# Patient Record
Sex: Male | Born: 1990 | Race: Black or African American | Hispanic: No | Marital: Single | Smoking: Former smoker
Health system: Southern US, Community
[De-identification: ages and names within clinical notes are randomized; demographics above are authoritative.]

---
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
## 2015-09-29 ENCOUNTER — Emergency Department (HOSPITAL_COMMUNITY)
Admission: EM | Admit: 2015-09-29 | Discharge: 2015-09-29 | Disposition: A | Discharge status: Home / Self Care | Payer: Medicaid Other | Attending: Emergency Medicine | Admitting: Emergency Medicine

## 2015-09-29 ENCOUNTER — Encounter (HOSPITAL_COMMUNITY): Payer: Self-pay | Admitting: Emergency Medicine

## 2015-09-29 DIAGNOSIS — L723 Sebaceous cyst: Secondary | ICD-10-CM | POA: Insufficient documentation

## 2015-09-29 DIAGNOSIS — L089 Local infection of the skin and subcutaneous tissue, unspecified: Secondary | ICD-10-CM

## 2015-09-29 DIAGNOSIS — Z720 Tobacco use: Secondary | ICD-10-CM | POA: Insufficient documentation

## 2015-09-29 MED ORDER — LIDOCAINE HCL (PF) 1 % IJ SOLN
2.0000 mL | Freq: Once | INTRAMUSCULAR | Status: AC
  Administered 2015-09-29: 2 mL
  Filled 2015-09-29: qty 5

## 2015-09-29 NOTE — ED Provider Notes (Signed)
CSN: [id number]     Arrival date & time 09/29/15  2104 History  By signing my name below, I, Phillis Haggis, attest that this documentation has been prepared under the direction and in the presence of Earley Favor, NP-C. Electronically Signed: Phillis Haggis, ED Scribe. 09/29/2015. 10:15 PM.  Chief Complaint  Patient presents with  . Skin Problem   The history is provided by the patient. No language interpreter was used.  HPI Comments: Randy Arias is a 24 y.o. male who presents to the Emergency Department complaining of a gradually resolving bump to the right side of his mouth onset 3 weeks ago. Pt states he is unable to "make the bump go down." He states that it first appeared as a white head, which he popped, but states that it came back. He denies pain to the area.   History reviewed. No pertinent past medical history. History reviewed. No pertinent past surgical history. No family history on file. Social History  Substance Use Topics  . Smoking status: Current Every Day Smoker  . Smokeless tobacco: None  . Alcohol Use: Yes    Review of Systems  Skin: Positive for wound.  All other systems reviewed and are negative.     Allergies  Review of patient's allergies indicates no known allergies.  Home Medications   Prior to Admission medications   Medication Sig Start Date End Date Taking? Authorizing Provider  Phenyleph-Doxylamine-DM-APAP (NYQUIL SEVERE COLD/FLU) 5-6.25-10-325 MG/15ML LIQD Take 30 mLs by mouth every 6 (six) hours as needed (cold).   Yes Historical Provider, MD   BP 145/86 mmHg  Pulse 92  Temp(Src) 98 F (36.7 C) (Oral)  Resp 16  SpO2 100% Physical Exam  Constitutional: He appears well-developed and well-nourished.  HENT:  Head: Normocephalic.    Eyes: Pupils are equal, round, and reactive to light.  Neck: Normal range of motion.  Cardiovascular: Normal rate and regular rhythm.   Pulmonary/Chest: Effort normal and breath sounds normal.   Musculoskeletal: Normal range of motion.  Neurological: He is alert.  Skin: No erythema.  Nursing note and vitals reviewed.   ED Course  Procedures (including critical care time) DIAGNOSTIC STUDIES: Oxygen Saturation is 100% on RA, normal by my interpretation.    COORDINATION OF CARE: 9:41 PM-Discussed treatment plan which includes I&D with pt at bedside and pt agreed to plan.   INCISION AND DRAINAGE PROCEDURE NOTE: Patient identification was confirmed and verbal consent was obtained. This procedure was performed by Earley Favor, NP-C at 10:14 PM. Site: right side of mouth Sterile procedures observed Needle size: 25 Anesthetic used (type and amt): 1% lidocaine without epi Blade size: 11 Drainage: thick purulent  Complexity: Complex Packing usednone Site anesthetized, incision made over site, wound drained and explored loculations, rinsed with copious amounts of normal saline, wound packed with sterile gauze, covered with dry, sterile dressing.  Pt tolerated procedure well without complications.  Instructions for care discussed verbally and pt provided with additional written instructions for homecare and f/u.   Labs Review Labs Reviewed - No data to display  Imaging Review No results found. I have personally reviewed and evaluated these images and lab results as part of my medical decision-making.   EKG Interpretation None     Patient to apply warm compresses tonight  MDM   Final diagnoses:  Infected sebaceous cyst of skin   I personally performed the services described in this documentation, which was scribed in my presence. The recorded information has been reviewed and is  accurate.   Earley FavorGail Dalbert Stillings, NP 09/29/15 [id number]  Bethann BerkshireJoseph Zammit, MD 10/03/15 [phone]

## 2015-09-29 NOTE — ED Notes (Signed)
Pt states he has had a bump on his face x 3 weeks and can't get it to go down. Area is to the R of his mouth. Alert and oriented.

## 2015-09-29 NOTE — Discharge Instructions (Signed)
Sebaceous Cyst Removal, Care After Refer to this sheet in the next few weeks. These instructions provide you with information about caring for yourself after your procedure. Your health care provider may also give you more specific instructions. Your treatment has been planned according to current medical practices, but problems sometimes occur. Call your health care provider if you have any problems or questions after your procedure. WHAT TO EXPECT AFTER THE PROCEDURE After your procedure, it is common to have:  Soreness in the area where your cyst was removed.  Tightness or itching from your skin sutures. HOME CARE INSTRUCTIONS  Take medicines only as directed by your health care provider.  If you were prescribed an antibiotic medicine, finish all of it even if you start to feel better.  Use antibiotic ointment as directed by your health care provider. Follow the instructions carefully.  There are many different ways to close and cover an incision, including stitches (sutures), skin glue, and adhesive strips. Follow your health care provider's instructions about:  Incision care.  Bandage (dressing) changes and removal.  Incision closure removal.  Keep the bandage (dressing) dry until your health care provider says that it can be removed. Take sponge baths only. Ask your health care provider when you can start showering or taking a bath.  After your dressing is off, check your incision every day for signs of infection. Watch for:  Redness, swelling, or pain.  Fluid, blood, or pus.  You can return to your normal activities. Do not do anything that stretches or puts pressure on your incision.  You can return to your normal diet.  Keep all follow-up visits as directed by your health care provider. This is important. SEEK MEDICAL CARE IF:  You have a fever.  Your incision bleeds.  You have redness, swelling, or pain in the incision area.  You have fluid, blood, or pus coming  from your incision.  Your cyst comes back after surgery.   This information is not intended to replace advice given to you by your health care provider. Make sure you discuss any questions you have with your health care provider.   Document Released: 12/10/2014 Document Reviewed: 12/10/2014 Elsevier Interactive Patient Education [employer]. You had a small infected cyst that was open and drained tonight please use a warm compress to the area

## 2015-11-05 ENCOUNTER — Emergency Department (HOSPITAL_COMMUNITY): Payer: Medicaid Other

## 2015-11-05 ENCOUNTER — Encounter (HOSPITAL_COMMUNITY): Payer: Self-pay | Admitting: Emergency Medicine

## 2015-11-05 ENCOUNTER — Emergency Department (HOSPITAL_COMMUNITY)
Admission: EM | Admit: 2015-11-05 | Discharge: 2015-11-06 | Disposition: A | Discharge status: Home / Self Care | Payer: Medicaid Other | Attending: Emergency Medicine | Admitting: Emergency Medicine

## 2015-11-05 DIAGNOSIS — R21 Rash and other nonspecific skin eruption: Secondary | ICD-10-CM

## 2015-11-05 DIAGNOSIS — Y9389 Activity, other specified: Secondary | ICD-10-CM | POA: Insufficient documentation

## 2015-11-05 DIAGNOSIS — W231XXA Caught, crushed, jammed, or pinched between stationary objects, initial encounter: Secondary | ICD-10-CM | POA: Insufficient documentation

## 2015-11-05 DIAGNOSIS — N4889 Other specified disorders of penis: Secondary | ICD-10-CM | POA: Insufficient documentation

## 2015-11-05 DIAGNOSIS — S5001XA Contusion of right elbow, initial encounter: Secondary | ICD-10-CM | POA: Insufficient documentation

## 2015-11-05 DIAGNOSIS — Y9289 Other specified places as the place of occurrence of the external cause: Secondary | ICD-10-CM | POA: Insufficient documentation

## 2015-11-05 DIAGNOSIS — Y990 Civilian activity done for income or pay: Secondary | ICD-10-CM | POA: Insufficient documentation

## 2015-11-05 DIAGNOSIS — F17200 Nicotine dependence, unspecified, uncomplicated: Secondary | ICD-10-CM | POA: Insufficient documentation

## 2015-11-05 NOTE — Discharge Instructions (Signed)
Contusion A contusion is a deep bruise. Contusions are the result of a blunt injury to tissues and muscle fibers under the skin. The injury causes bleeding under the skin. The skin overlying the contusion may turn blue, purple, or yellow. Minor injuries will give you a painless contusion, but more severe contusions may stay painful and swollen for a few weeks.  CAUSES  This condition is usually caused by a blow, trauma, or direct force to an area of the body. SYMPTOMS  Symptoms of this condition include:  Swelling of the injured area.  Pain and tenderness in the injured area.  Discoloration. The area may have redness and then turn blue, purple, or yellow. DIAGNOSIS  This condition is diagnosed based on a physical exam and medical history. An X-ray, CT scan, or MRI may be needed to determine if there are any associated injuries, such as broken bones (fractures). TREATMENT  Specific treatment for this condition depends on what area of the body was injured. In general, the best treatment for a contusion is resting, icing, applying pressure to (compression), and elevating the injured area. This is often called the RICE strategy. Over-the-counter anti-inflammatory medicines may also be recommended for pain control.  HOME CARE INSTRUCTIONS   Rest the injured area.  If directed, apply ice to the injured area:  Put ice in a plastic bag.  Place a towel between your skin and the bag.  Leave the ice on for 20 minutes, 2-3 times per day.  If directed, apply light compression to the injured area using an elastic bandage. Make sure the bandage is not wrapped too tightly. Remove and reapply the bandage as directed by your health care provider.  If possible, raise (elevate) the injured area above the level of your heart while you are sitting or lying down.  Take over-the-counter and prescription medicines only as told by your health care provider. SEEK MEDICAL CARE IF:  Your symptoms do not  improve after several days of treatment.  Your symptoms get worse.  You have difficulty moving the injured area. SEEK IMMEDIATE MEDICAL CARE IF:   You have severe pain.  You have numbness in a hand or foot.  Your hand or foot turns pale or cold.   This information is not intended to replace advice given to you by your health care provider. Make sure you discuss any questions you have with your health care provider.   Document Released: 08/29/2005 Document Revised: 08/10/2015 Document Reviewed: 04/06/2015 Elsevier Interactive Patient Education 2016 Elsevier Inc. Rash A rash is a change in the color or texture of the skin. There are many different types of rashes. You may have other problems that accompany your rash. CAUSES   Infections.  Allergic reactions. This can include allergies to pets or foods.  Certain medicines.  Exposure to certain chemicals, soaps, or cosmetics.  Heat.  Exposure to poisonous plants.  Tumors, both cancerous and noncancerous. SYMPTOMS   Redness.  Scaly skin.  Itchy skin.  Dry or cracked skin.  Bumps.  Blisters.  Pain. DIAGNOSIS  Your caregiver may do a physical exam to determine what type of rash you have. A skin sample (biopsy) may be taken and examined under a microscope. TREATMENT  Treatment depends on the type of rash you have. Your caregiver may prescribe certain medicines. For serious conditions, you may need to see a skin doctor (dermatologist). HOME CARE INSTRUCTIONS   Avoid the substance that caused your rash.  Do not scratch your rash. This can  cause infection.  You may take cool baths to help stop itching.  Only take over-the-counter or prescription medicines as directed by your caregiver.  Keep all follow-up appointments as directed by your caregiver. SEEK IMMEDIATE MEDICAL CARE IF:  You have increasing pain, swelling, or redness.  You have a fever.  You have new or severe symptoms.  You have body aches,  diarrhea, or vomiting.  Your rash is not better after 3 days. MAKE SURE YOU:  Understand these instructions.  Will watch your condition.  Will get help right away if you are not doing well or get worse.   This information is not intended to replace advice given to you by your health care provider. Make sure you discuss any questions you have with your health care provider.   Document Released: 11/09/2002 Document Revised: 12/10/2014 Document Reviewed: 04/06/2015 Elsevier Interactive Patient Education [employer]

## 2015-11-05 NOTE — ED Provider Notes (Signed)
CSN: [id number]     Arrival date & time 11/05/15  2118 History  By signing my name below, I, Elon Spanner, attest that this documentation has been prepared under the direction and in the presence of Trisha Mangle, PA-C. Electronically Signed: Elon Spanner ED Scribe. 11/05/2015. 10:14 PM.    Chief Complaint  Patient presents with  . Rash  . Arm Pain   The history is provided by the patient. No language interpreter was used.  HPI Comments: Randy Arias is a 24 y.o. male who presents to the Emergency Department complaining of an improving rash on the tip of the penis onset 1.5 weeks ago.  He notes unprotected sex with anew partner.  He denies new dermatologic exposures.  He denies fever, chills, penile discharge.  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  He reports his hand was numb briefly during the incident, however, this has resolved and he notes persistent, moderate pain in the area.   History reviewed. No pertinent past medical history. History reviewed. No pertinent past surgical history. No family history on file. Social History  Substance Use Topics  . Smoking status: Current Every Day Smoker  . Smokeless tobacco: None  . Alcohol Use: Yes    Review of Systems A complete 10 system review of systems was obtained and all systems are negative except as noted in the HPI and PMH.   Allergies  Review of patient's allergies indicates no known allergies.  Home Medications   Prior to Admission medications   Not on File   BP 165/87 mmHg  Pulse 97  Temp(Src) 97.8 F (36.6 C) (Oral)  Resp 16  SpO2 99% Physical Exam  Constitutional: He is oriented to person, place, and time. He appears well-developed and well-nourished. No distress.  HENT:  Head: Normocephalic and atraumatic.  Eyes: Conjunctivae and EOM are normal.  Neck: Neck supple. No tracheal deviation present.  Cardiovascular:  Normal rate.   Pulmonary/Chest: Effort normal. No respiratory distress.  Genitourinary:  Distal penis is circumcised and there is a fine, pin-point, raised rash to the proximal glans.  Musculoskeletal: Normal range of motion.  Erythema and slight swelling to right mid-forearm.  Neurological: He is alert and oriented to person, place, and time.  Skin: Skin is warm and dry.  Psychiatric: He has a normal mood and affect. His behavior is normal.  Nursing note and vitals reviewed.   ED Course  Procedures (including critical care time)  DIAGNOSTIC STUDIES: Oxygen Saturation is 99% on RA, normal by my interpretation.    COORDINATION OF CARE:  10:14 PM Discussed suspicion of dermatitis.  Will order labs to r/o STD.  Will order imaging of left forearm.  Patient acknowledges and agrees with plan.    Labs Review Labs Reviewed - No data to display  Imaging Review No results found. I have personally reviewed and evaluated these images and lab results as part of my medical decision-making.   EKG Interpretation None      MDM gc and ct pending.  Pt advised to use mild soap.  Ace wrap to right arm.     Final diagnoses:  Contusion, elbow, right, initial encounter  Rash of penis      I personally performed the services in this documentation, which was scribed in my presence.  The recorded information has been reviewed and considered.   Barnet Pall.  Elson Areas, PA-C 11/06/15 [phone]  Lorre NickAnthony Allen, MD 11/12/15 [phone]

## 2015-11-05 NOTE — ED Notes (Signed)
Pt from home c/o rash to penis. He reports unprotected sex with a new partner. Denies Dysuria. He is also reporting left arm pain from getting his arm caught in between a for lift today. Good ROM present.

## 2015-11-07 LAB — GC/CHLAMYDIA PROBE AMP (~~LOC~~) NOT AT ARMC
CHLAMYDIA, DNA PROBE: NEGATIVE
NEISSERIA GONORRHEA: NEGATIVE

## 2016-11-15 ENCOUNTER — Emergency Department (HOSPITAL_COMMUNITY)
Admission: EM | Admit: 2016-11-15 | Discharge: 2016-11-15 | Disposition: A | Discharge status: Home / Self Care | Payer: Medicaid Other | Attending: Physician Assistant | Admitting: Physician Assistant

## 2016-11-15 ENCOUNTER — Encounter (HOSPITAL_COMMUNITY): Payer: Self-pay | Admitting: *Deleted

## 2016-11-15 DIAGNOSIS — Z87891 Personal history of nicotine dependence: Secondary | ICD-10-CM | POA: Insufficient documentation

## 2016-11-15 DIAGNOSIS — R21 Rash and other nonspecific skin eruption: Secondary | ICD-10-CM | POA: Insufficient documentation

## 2016-11-15 LAB — URINALYSIS, ROUTINE W REFLEX MICROSCOPIC
Bacteria, UA: NONE SEEN
Bilirubin Urine: NEGATIVE
GLUCOSE, UA: NEGATIVE mg/dL
HGB URINE DIPSTICK: NEGATIVE
Ketones, ur: 5 mg/dL — AB
Leukocytes, UA: NEGATIVE
NITRITE: NEGATIVE
PH: 5 (ref 5.0–8.0)
PROTEIN: NEGATIVE mg/dL
SPECIFIC GRAVITY, URINE: 1.028 (ref 1.005–1.030)

## 2016-11-15 LAB — GC/CHLAMYDIA PROBE AMP (~~LOC~~) NOT AT ARMC
Chlamydia: NEGATIVE
NEISSERIA GONORRHEA: NEGATIVE

## 2016-11-15 MED ORDER — CLOTRIMAZOLE 1 % EX CREA: TOPICAL_CREAM | CUTANEOUS | 0 refills | Status: AC

## 2016-11-15 NOTE — Discharge Instructions (Signed)
We think that your genital exam today is normal. I do not see any problems with yeast, or STDs. However we sent yourr urine 2 check for STDs. If you have development of that white rash again, you may use this cream to help with yeast.

## 2016-11-15 NOTE — ED Provider Notes (Signed)
[facility]-EMERGENCY DEPT Provider Note   CSN: [id number] Arrival date & time: 11/15/16  [id number]     History   Chief Complaint Chief Complaint  Patient presents with  . Rash    HPI Randy Arias is a 25 y.o. male.  HPI   Is very pleasant 25 year old male presenting with rash on his penis. Patient reports that a week ago he had some white rash to the tip of his penis. He used some Dove soap on it because it looked like a dry skin that was also present on other parts of his body. It got much better. However he was coming here just to make sure everything was okay. Patient's had no recent unprotected sex. No discharge. No burning with urination. No fevers. No other symptoms.  Has no pain during sex, urination, or palpation.  History reviewed. No pertinent past medical history.  There are no active problems to display for this patient.   History reviewed. No pertinent surgical history.     Home Medications    Prior to Admission medications   Not on File    Family History History reviewed. No pertinent family history.  Social History Social History  Substance Use Topics  . Smoking status: Former Games developermoker  . Smokeless tobacco: Never Used  . Alcohol use Yes     Allergies   Patient has no known allergies.   Review of Systems Review of Systems  Constitutional: Negative for fatigue.  Genitourinary: Negative for difficulty urinating, discharge, dysuria, enuresis, flank pain, frequency, genital sores, penile pain, penile swelling and testicular pain.  All other systems reviewed and are negative.    Physical Exam Updated Vital Signs BP (!) 163/110 (BP Location: Left Arm)   Pulse 108   Temp 98.4 F (36.9 C) (Oral)   Resp 18   Ht 6\' 3"  (1.905 m)   Wt 165 lb (74.8 kg)   SpO2 99%   BMI 20.62 kg/m   Physical Exam  Constitutional: He is oriented to person, place, and time. He appears well-nourished.  HENT:  Head: Normocephalic.  Eyes: Conjunctivae are normal.    Cardiovascular: Normal rate.   Pulmonary/Chest: Effort normal.  Genitourinary:  Genitourinary Comments: Normal-appearing genitalia. No rashes. No lesions. No discharge.  Neurological: He is oriented to person, place, and time.  Skin: Skin is warm and dry. He is not diaphoretic.  Psychiatric: He has a normal mood and affect. His behavior is normal.     ED Treatments / Results  Labs (all labs ordered are listed, but only abnormal results are displayed) Labs Reviewed  URINALYSIS, ROUTINE W REFLEX MICROSCOPIC - Abnormal; Notable for the following:       Result Value   Color, Urine AMBER (*)    APPearance TURBID (*)    Ketones, ur 5 (*)    Squamous Epithelial / LPF 0-5 (*)    All other components within normal limits  GC[facility]AMYDIA [facility] AMP [facility]    EKG  EKG Interpretation None       Radiology No results found.  Procedures Procedures (including critical care time)  Medications Ordered in ED Medications - No data to display   Initial Impression / Assessment and Plan / ED Course  I have reviewed the triage vital signs and the nursing notes.  Pertinent labs & imaging results that were available during my care of the patient were reviewed by me and considered in my medical decision making (see chart for details).  Clinical Course  Patient is a pleasant 25 year old male presenting with white bumps on his penis. He reports the use of Dove soap and he got better. I see no evidence of any kind of abnormal rash at this point. He does have tiny flesh-colored colored papules that appear to be withinnormal skin findings on the genitalia exam. There is no pain. No vesicles. No erythema.  We'll give him a cream for yeast encase what he had previously was a yeast  infection. We'll have him follow-up as needed. GC chlamydia sent.  Final Clinical Impressions(s) / ED Diagnoses   Final diagnoses:  None    New Prescriptions New Prescriptions   No medications  on file     Katlen Seyer Randall AnLyn Brant Peets, MD 11/15/16 [phone]

## 2016-11-15 NOTE — ED Notes (Signed)
Bed: [facility] Expected date:  Expected time:  Means of arrival:  Comments: 

## 2016-11-15 NOTE — ED Triage Notes (Signed)
Patient is alert and oriented x4.  He is being seen for a rash that started 2 weeks ago around the head of his penis.  Patient states that he looked it up on the Internet and that it could cause kidney problems and thinks he might need a CT.  Currently he rates his pain as pressure.

## 2016-11-15 NOTE — ED Notes (Signed)
Bed: WA17 Expected date:  Expected time:  Means of arrival:  Comments: [facility] D  

## 2020-09-11 ENCOUNTER — Emergency Department (HOSPITAL_COMMUNITY)
Admission: EM | Admit: 2020-09-11 | Discharge: 2020-09-11 | Disposition: A | Discharge status: Home / Self Care | Payer: No Typology Code available for payment source | Attending: Emergency Medicine | Admitting: Emergency Medicine

## 2020-09-11 ENCOUNTER — Emergency Department (HOSPITAL_COMMUNITY): Payer: No Typology Code available for payment source

## 2020-09-11 ENCOUNTER — Encounter (HOSPITAL_COMMUNITY): Payer: Self-pay | Admitting: Radiology

## 2020-09-11 DIAGNOSIS — S50311A Abrasion of right elbow, initial encounter: Secondary | ICD-10-CM | POA: Insufficient documentation

## 2020-09-11 DIAGNOSIS — Y92410 Unspecified street and highway as the place of occurrence of the external cause: Secondary | ICD-10-CM | POA: Insufficient documentation

## 2020-09-11 DIAGNOSIS — Z20822 Contact with and (suspected) exposure to covid-19: Secondary | ICD-10-CM | POA: Diagnosis not present

## 2020-09-11 DIAGNOSIS — T1490XA Injury, unspecified, initial encounter: Secondary | ICD-10-CM

## 2020-09-11 DIAGNOSIS — S80212A Abrasion, left knee, initial encounter: Secondary | ICD-10-CM | POA: Diagnosis not present

## 2020-09-11 DIAGNOSIS — S80912A Unspecified superficial injury of left knee, initial encounter: Secondary | ICD-10-CM | POA: Diagnosis present

## 2020-09-11 LAB — COMPREHENSIVE METABOLIC PANEL
ALT: 17 U/L (ref 0–44)
AST: 27 U/L (ref 15–41)
Albumin: 4.8 g/dL (ref 3.5–5.0)
Alkaline Phosphatase: 63 U/L (ref 38–126)
Anion gap: 17 — ABNORMAL HIGH (ref 5–15)
BUN: 10 mg/dL (ref 6–20)
CO2: 21 mmol/L — ABNORMAL LOW (ref 22–32)
Calcium: 10.1 mg/dL (ref 8.9–10.3)
Chloride: 104 mmol/L (ref 98–111)
Creatinine, Ser: 1.31 mg/dL — ABNORMAL HIGH (ref 0.61–1.24)
GFR, Estimated: >60 mL/min (ref >60)
Glucose, Bld: 111 mg/dL — ABNORMAL HIGH (ref 70–99)
Potassium: 4.1 mmol/L (ref 3.5–5.1)
Sodium: 142 mmol/L (ref 135–145)
Total Bilirubin: 0.5 mg/dL (ref 0.3–1.2)
Total Protein: 7.5 g/dL (ref 6.5–8.1)

## 2020-09-11 LAB — RESPIRATORY PANEL BY RT PCR (FLU A&B, COVID)
Influenza A by PCR: NEGATIVE
Influenza B by PCR: NEGATIVE
SARS Coronavirus 2 by RT PCR: NEGATIVE

## 2020-09-11 LAB — I-STAT CHEM 8, ED
BUN: 9 mg/dL (ref 6–20)
Calcium, Ion: 1.08 mmol/L — ABNORMAL LOW (ref 1.15–1.40)
Chloride: 105 mmol/L (ref 98–111)
Creatinine, Ser: 1.1 mg/dL (ref 0.61–1.24)
Glucose, Bld: 107 mg/dL — ABNORMAL HIGH (ref 70–99)
HCT: 47 % (ref 39.0–52.0)
Hemoglobin: 16 g/dL (ref 13.0–17.0)
Potassium: 3.9 mmol/L (ref 3.5–5.1)
Sodium: 142 mmol/L (ref 135–145)
TCO2: 22 mmol/L (ref 22–32)

## 2020-09-11 LAB — CBC
HCT: 46.8 % (ref 39.0–52.0)
Hemoglobin: 14.8 g/dL (ref 13.0–17.0)
MCH: 25.4 pg — ABNORMAL LOW (ref 26.0–34.0)
MCHC: 31.6 g/dL (ref 30.0–36.0)
MCV: 80.3 fL (ref 80.0–100.0)
Platelets: 379 10*3/uL (ref 150–400)
RBC: 5.83 MIL/uL — ABNORMAL HIGH (ref 4.22–5.81)
RDW: 13.9 % (ref 11.5–15.5)
WBC: 13 10*3/uL — ABNORMAL HIGH (ref 4.0–10.5)
nRBC: 0 % (ref 0.0–0.2)

## 2020-09-11 LAB — SAMPLE TO BLOOD BANK

## 2020-09-11 LAB — PROTIME-INR
INR: 1 (ref 0.8–1.2)
Prothrombin Time: 12.9 seconds (ref 11.4–15.2)

## 2020-09-11 LAB — LACTIC ACID, PLASMA: Lactic Acid, Venous: 5.5 mmol/L (ref 0.5–1.9)

## 2020-09-11 LAB — ETHANOL: Alcohol, Ethyl (B): <10 mg/dL (ref <10)

## 2020-09-11 MED ORDER — HYDROMORPHONE HCL 1 MG/ML IJ SOLN
1.0000 mg | Freq: Once | INTRAMUSCULAR | Status: AC
  Administered 2020-09-11: 1 mg via INTRAVENOUS
  Filled 2020-09-11: qty 1

## 2020-09-11 MED ORDER — IOHEXOL 300 MG/ML  SOLN
100.0000 mL | Freq: Once | INTRAMUSCULAR | Status: AC | PRN
  Administered 2020-09-11: 100 mL via INTRAVENOUS

## 2020-09-11 MED ORDER — CYCLOBENZAPRINE HCL 10 MG PO TABS: 10.0000 mg | ORAL_TABLET | Freq: Two times a day (BID) | ORAL | 0 refills | Status: AC | PRN

## 2020-09-11 MED ORDER — LACTATED RINGERS IV BOLUS
1000.0000 mL | Freq: Once | INTRAVENOUS | Status: AC
  Administered 2020-09-11: 1000 mL via INTRAVENOUS

## 2020-09-11 MED ORDER — IBUPROFEN 800 MG PO TABS
800.0000 mg | ORAL_TABLET | Freq: Once | ORAL | Status: AC
  Administered 2020-09-11: 800 mg via ORAL
  Filled 2020-09-11: qty 1

## 2020-09-11 MED ORDER — ACETAMINOPHEN 500 MG PO TABS
1000.0000 mg | ORAL_TABLET | Freq: Once | ORAL | Status: AC
  Administered 2020-09-11: 1000 mg via ORAL
  Filled 2020-09-11: qty 2

## 2020-09-11 NOTE — Discharge Instructions (Signed)
You may take Tylenol and ibuprofen together every 6-8 hours for pain.  You may find that ice helps muscle aches as well.  You can use your crutches to help you get around while your knee is bothering you, but you do not need to use them as long as it is comfortable to walk around on your own.  Please return to the emergency department if you have severe worsening or other concerning symptoms.
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
## 2020-09-11 NOTE — ED Notes (Signed)
Pt to MRI at this time  Pt remains alert and oriented x's3
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
## 2020-09-11 NOTE — ED Notes (Signed)
Pt returned from CT via stretcher on CCM with TRN

## 2020-09-11 NOTE — ED Notes (Signed)
Pt remains alert and oriented x's 3   Pt to CT at this time.
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
## 2020-09-12 ENCOUNTER — Encounter (HOSPITAL_COMMUNITY): Payer: Self-pay | Admitting: *Deleted

## 2020-09-20 ENCOUNTER — Other Ambulatory Visit: Payer: Self-pay | Admitting: Chiropractic Medicine

## 2020-09-20 DIAGNOSIS — M25562 Pain in left knee: Secondary | ICD-10-CM

## 2020-10-01 ENCOUNTER — Other Ambulatory Visit: Payer: Self-pay

## 2020-10-01 ENCOUNTER — Ambulatory Visit
Admission: RE | Admit: 2020-10-01 | Discharge: 2020-10-01 | Disposition: A | Discharge status: Home / Self Care | Payer: Self-pay | Source: Ambulatory Visit | Attending: Chiropractic Medicine | Admitting: Chiropractic Medicine

## 2020-10-01 DIAGNOSIS — M25562 Pain in left knee: Secondary | ICD-10-CM

## 2020-10-08 ENCOUNTER — Other Ambulatory Visit: Payer: Self-pay
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
## 2024-06-24 ENCOUNTER — Encounter (HOSPITAL_COMMUNITY): Payer: Self-pay

## 2024-06-24 ENCOUNTER — Emergency Department (HOSPITAL_COMMUNITY)
Admission: EM | Admit: 2024-06-24 | Discharge: 2024-06-24 | Disposition: A | Discharge status: Home / Self Care | Payer: Self-pay | Attending: Emergency Medicine | Admitting: Emergency Medicine

## 2024-06-24 ENCOUNTER — Other Ambulatory Visit: Payer: Self-pay

## 2024-06-24 ENCOUNTER — Emergency Department (HOSPITAL_COMMUNITY)
Admit: 2024-06-24 | Discharge: 2024-06-24 | Disposition: A | Discharge status: Home / Self Care | Payer: Self-pay | Attending: Emergency Medicine | Admitting: Emergency Medicine

## 2024-06-24 ENCOUNTER — Emergency Department (HOSPITAL_COMMUNITY): Payer: Self-pay

## 2024-06-24 DIAGNOSIS — D72829 Elevated white blood cell count, unspecified: Secondary | ICD-10-CM | POA: Insufficient documentation

## 2024-06-24 DIAGNOSIS — R569 Unspecified convulsions: Secondary | ICD-10-CM | POA: Insufficient documentation

## 2024-06-24 LAB — CBC
HCT: 49.3 % (ref 39.0–52.0)
Hemoglobin: 15.5 g/dL (ref 13.0–17.0)
MCH: 25.3 pg — ABNORMAL LOW (ref 26.0–34.0)
MCHC: 31.4 g/dL (ref 30.0–36.0)
MCV: 80.4 fL (ref 80.0–100.0)
Platelets: 327 K/uL (ref 150–400)
RBC: 6.13 MIL/uL — ABNORMAL HIGH (ref 4.22–5.81)
RDW: 13.6 % (ref 11.5–15.5)
WBC: 18.4 K/uL — ABNORMAL HIGH (ref 4.0–10.5)
nRBC: 0 % (ref 0.0–0.2)

## 2024-06-24 LAB — COMPREHENSIVE METABOLIC PANEL WITH GFR
ALT: 14 U/L (ref 0–44)
AST: 20 U/L (ref 15–41)
Albumin: 4.5 g/dL (ref 3.5–5.0)
Alkaline Phosphatase: 71 U/L (ref 38–126)
Anion gap: 10 (ref 5–15)
BUN: 14 mg/dL (ref 6–20)
CO2: 25 mmol/L (ref 22–32)
Calcium: 9.5 mg/dL (ref 8.9–10.3)
Chloride: 105 mmol/L (ref 98–111)
Creatinine, Ser: 0.98 mg/dL (ref 0.61–1.24)
GFR, Estimated: >60 mL/min (ref >60)
Glucose, Bld: 92 mg/dL (ref 70–99)
Potassium: 4.1 mmol/L (ref 3.5–5.1)
Sodium: 140 mmol/L (ref 135–145)
Total Bilirubin: 0.4 mg/dL (ref 0.0–1.2)
Total Protein: 8 g/dL (ref 6.5–8.1)

## 2024-06-24 LAB — RAPID URINE DRUG SCREEN, HOSP PERFORMED
Amphetamines: NOT DETECTED
Barbiturates: NOT DETECTED
Benzodiazepines: NOT DETECTED
Cocaine: NOT DETECTED
Opiates: NOT DETECTED
Tetrahydrocannabinol: POSITIVE — AB

## 2024-06-24 LAB — URINALYSIS, W/ REFLEX TO CULTURE (INFECTION SUSPECTED)
Bacteria, UA: NONE SEEN
Bilirubin Urine: NEGATIVE
Glucose, UA: NEGATIVE mg/dL
Hgb urine dipstick: NEGATIVE
Ketones, ur: 5 mg/dL — AB
Leukocytes,Ua: NEGATIVE
Nitrite: NEGATIVE
Protein, ur: 100 mg/dL — AB
Specific Gravity, Urine: 1.015 (ref 1.005–1.030)
pH: 5 (ref 5.0–8.0)

## 2024-06-24 LAB — MAGNESIUM: Magnesium: 2.4 mg/dL (ref 1.7–2.4)

## 2024-06-24 LAB — CBG MONITORING, ED: Glucose-Capillary: 147 mg/dL — ABNORMAL HIGH (ref 70–99)

## 2024-06-24 MED ORDER — SODIUM CHLORIDE 0.9 % IV BOLUS
1000.0000 mL | Freq: Once | INTRAVENOUS | Status: AC
  Administered 2024-06-24: 1000 mL via INTRAVENOUS

## 2024-06-24 MED ORDER — ONDANSETRON HCL 4 MG/2ML IJ SOLN
4.0000 mg | Freq: Once | INTRAMUSCULAR | Status: AC
  Administered 2024-06-24: 4 mg via INTRAVENOUS
  Filled 2024-06-24: qty 2

## 2024-06-24 MED ORDER — ACETAMINOPHEN 325 MG PO TABS
650.0000 mg | ORAL_TABLET | Freq: Once | ORAL | Status: AC
  Administered 2024-06-24: 650 mg via ORAL
  Filled 2024-06-24: qty 2

## 2024-06-24 MED ORDER — GADOBUTROL 1 MMOL/ML IV SOLN
8.0000 mL | Freq: Once | INTRAVENOUS | Status: AC | PRN
  Administered 2024-06-24: 8 mL via INTRAVENOUS

## 2024-06-24 NOTE — ED Provider Notes (Signed)
 [place] EMERGENCY DEPARTMENT AT [facility] Provider Note   CSN: [id number] Arrival date & time: 06/24/24  0630     Patient presents with: Dehydration and Seizures   Randy Arias is a 33 y.o. male with non-contributory PMHx who presents to ED after patient's wife witnessed a seizure. Patient not forthcoming with information - just stating that he woke up with EMS at his front door. Patient endorsing intermittent lightheadedness since stopping all THC use 3 days ago and currently endorses nausea. Patient also endorses an episode of diarrhea 1-2 days ago which has since resolved. Patient denies hx of seizures.  Wife eventually presenting to bedside. She states that patient was getting ready for work around 5:30AM. She then noticed patient in bed shaking and diaphoretic for around 1-2 min followed by around 30 minutes of patient not responding to questions but breathing appropriately.  No urinary/fecal incontinence or tongue biting. She states that patient stayed home yesterday from work d/t his lightheadedness and nausea, but was planning on going into work today.  Denies fever, chest pain, dyspnea, cough, vomiting, dysuria, hematuria, hematochezia.     Seizures      Prior to Admission medications   Medication Sig Start Date End Date Taking? Authorizing Provider  clotrimazole  (LOTRIMIN ) 1 % cream Apply to affected area 2 times daily 11/15/16   Mackuen, Courteney Lyn, MD  cyclobenzaprine  (FLEXERIL ) 10 MG tablet Take 1 tablet (10 mg total) by mouth 2 (two) times daily as needed for muscle spasms. 09/11/20   Clemencia Duncan, MD    Allergies: Patient has no known allergies.    Review of Systems  Neurological:  Positive for seizures.    Updated Vital Signs BP 131/79   Pulse 68   Temp 98.3 F (36.8 C) (Oral)   Resp 18   Ht 6' 3 (1.905 m)   Wt 79.4 kg   SpO2 100%   BMI 21.87 kg/m   Physical Exam Vitals and nursing note reviewed.  Constitutional:       General: He is not in acute distress.    Appearance: He is not ill-appearing or toxic-appearing.  HENT:     Head: Normocephalic and atraumatic.     Mouth/Throat:     Mouth: Mucous membranes are moist.  Eyes:     General: No scleral icterus.       Right eye: No discharge.        Left eye: No discharge.     Extraocular Movements: Extraocular movements intact.     Conjunctiva/sclera: Conjunctivae normal.     Pupils: Pupils are equal, round, and reactive to light.  Cardiovascular:     Rate and Rhythm: Normal rate and regular rhythm.     Pulses: Normal pulses.     Heart sounds: Normal heart sounds. No murmur heard. Pulmonary:     Effort: Pulmonary effort is normal. No respiratory distress.     Breath sounds: Normal breath sounds. No wheezing, rhonchi or rales.  Abdominal:     General: Abdomen is flat. Bowel sounds are normal. There is no distension.     Palpations: Abdomen is soft. There is no mass.     Tenderness: There is no abdominal tenderness.  Musculoskeletal:     Right lower leg: No edema.     Left lower leg: No edema.  Skin:    General: Skin is warm and dry.     Findings: No rash.  Neurological:     General: No focal deficit present.  Mental Status: He is alert and oriented to person, place, and time. Mental status is at baseline.     Comments: GCS 15. Speech is goal oriented. No deficits appreciated to CN III-XII; symmetric eyebrow raise, no facial drooping, tongue midline. Patient has equal grip strength bilaterally with 5/5 strength against resistance in all major muscle groups bilaterally. Sensation to light touch intact. Patient moves extremities without ataxia.   Psychiatric:        Mood and Affect: Mood normal.        Behavior: Behavior normal.     (all labs ordered are listed, but only abnormal results are displayed) Labs Reviewed  CBC - Abnormal; Notable for the following components:      Result Value   WBC 18.4 (*)    RBC 6.13 (*)    MCH 25.3 (*)    All  other components within normal limits  RAPID URINE DRUG SCREEN, HOSP PERFORMED - Abnormal; Notable for the following components:   Tetrahydrocannabinol POSITIVE (*)    All other components within normal limits  URINALYSIS, W/ REFLEX TO CULTURE (INFECTION SUSPECTED) - Abnormal; Notable for the following components:   Color, Urine STRAW (*)    Ketones, ur 5 (*)    Protein, ur 100 (*)    All other components within normal limits  CBG MONITORING, ED - Abnormal; Notable for the following components:   Glucose-Capillary 147 (*)    All other components within normal limits  COMPREHENSIVE METABOLIC PANEL WITH GFR  MAGNESIUM    EKG: None  Radiology: EEG adult Result Date: 06/24/2024 Shelton Arlin KIDD, MD     06/24/2024  2:52 PM Patient Name: Randy Arias MRN: [id number] Epilepsy Attending: Arlin KIDD Shelton Referring Physician/Provider: Hoy Nidia JULIANNA DEVONNA Date: 06/24/2024 Duration: 33.58 mins Patient history: 32yo with seizure. EEG to evaluate for seizure Level of alertness: Awake AEDs during EEG study: None Technical aspects: This EEG study was done with scalp electrodes positioned according to the 10-20 International system of electrode placement. Electrical activity was reviewed with band pass filter of 1-70Hz , sensitivity of 7 uV/mm, display speed of 36mm/sec with a 60Hz  notched filter applied as appropriate. EEG data were recorded continuously and digitally stored.  Video monitoring was available and reviewed as appropriate. Description: The posterior dominant rhythm consists of 9 Hz activity of moderate voltage (25-35 uV) seen predominantly in posterior head regions, symmetric and reactive to eye opening and eye closing. Hyperventilation and photic stimulation were not performed.   IMPRESSION: This study is within normal limits. No seizures or epileptiform discharges were seen throughout the recording. A normal interictal EEG does not exclude the diagnosis of epilepsy. Arlin KIDD Shelton    MR Brain W and Wo Contrast Result Date: 06/24/2024 CLINICAL DATA:  Provided history: Seizure, new onset, no history of trauma. EXAM: MRI HEAD WITHOUT AND WITH CONTRAST TECHNIQUE: Multiplanar, multiecho pulse sequences of the brain and surrounding structures were obtained without and with intravenous contrast. CONTRAST:  8mL GADAVIST  GADOBUTROL  1 MMOL/ML IV SOLN COMPARISON:  Head CT 06/24/2024. FINDINGS: Brain: Cerebral volume is normal. Tiny nonspecific focus of T2 FLAIR hyperintense signal abnormality within the anterior right frontal lobe white matter (series 9, image 30). No cortical encephalomalacia is identified. No appreciable hippocampal size or signal asymmetry. There is no acute infarct. No evidence of an intracranial mass. No chronic intracranial blood products. No extra-axial fluid collection. No midline shift. No pathologic intracranial enhancement identified. Vascular: Maintained flow voids within the proximal large arterial vessels. Skull and  upper cervical spine: No focal worrisome marrow lesion. Sinuses/Orbits: No mass or acute finding within the imaged orbits. No significant paranasal sinus disease. IMPRESSION: 1.  No evidence of an acute intracranial abnormality. 2. Tiny nonspecific chronic insult within the anterior right frontal lobe white matter. 3. Otherwise unremarkable MRI appearance of the brain. 4. No specific seizure etiology is identified. Electronically Signed   By: Rockey Childs D.O.   On: 06/24/2024 13:08   CT Head Wo Contrast Result Date: 06/24/2024 CLINICAL DATA:  New onset seizure. EXAM: CT HEAD WITHOUT CONTRAST TECHNIQUE: Contiguous axial images were obtained from the base of the skull through the vertex without intravenous contrast. RADIATION DOSE REDUCTION: This exam was performed according to the departmental dose-optimization program which includes automated exposure control, adjustment of the mA and/or kV according to patient size and/or use of iterative reconstruction  technique. COMPARISON:  09/11/2020 FINDINGS: Brain: There is no evidence for acute hemorrhage, hydrocephalus, mass lesion, or abnormal extra-axial fluid collection. No definite CT evidence for acute infarction. Vascular: No hyperdense vessel or unexpected calcification. Skull: No evidence for fracture. No worrisome lytic or sclerotic lesion. Sinuses/Orbits: The visualized paranasal sinuses and mastoid air cells are clear. Visualized portions of the globes and intraorbital fat are unremarkable. Other: None. IMPRESSION: No acute intracranial abnormality. Electronically Signed   By: Camellia Candle M.D.   On: 06/24/2024 07:53     Procedures   Medications Ordered in the ED  ondansetron  (ZOFRAN ) injection 4 mg (4 mg Intravenous Given 06/24/24 0749)  sodium chloride  0.9 % bolus 1,000 mL (0 mLs Intravenous Stopped 06/24/24 0941)  acetaminophen  (TYLENOL ) tablet 650 mg (650 mg Oral Given 06/24/24 0846)  gadobutrol  (GADAVIST ) 1 MMOL/ML injection 8 mL (8 mLs Intravenous Contrast Given 06/24/24 1212)    Clinical Course as of 06/24/24 1631  Wed Jun 24, 2024  1043 CT Head Wo Contrast [SM]    Clinical Course User Index [SM] Hoy Nidia FALCON, PA-C                                 Medical Decision Making Amount and/or Complexity of Data Reviewed Labs: ordered. Radiology: ordered. Decision-making details documented in ED Course.  Risk OTC drugs. Prescription drug management.   This patient presents to the ED for concern of seizure, this involves an extensive number of treatment options, and is a complaint that carries with it a high risk of complications and morbidity.  The differential diagnosis includes CVA, ICH, intracranial mass, critical dehydration, heptatic dysfunction, uremia, hypercarbia, intoxication/withdrawal, endocrine abnormality, sepsis/infection.   Co morbidities that complicate the patient evaluation  none   Additional history obtained:  No PCP listed in chart   Problem List  / ED Course / Critical interventions / Medication management  Patient presented for seizure. No hx of seizures. Physical/neuro exam reassuring. Patient afebrile with stable vitals.  Patient endorsing some lightheadedness and nausea yesterday, but denies any other recent infectious symptoms. I Ordered, and personally interpreted labs.  UA not concerning for infection.  CBC with leukocytosis at 18.4.  No anemia.  CMP reassuring.  CBG 147.  Magnesium 2.4.  UDS positive for cannabis.  I personally viewed and interpreted the EKG/cardiac monitored which showed an underlying rhythm of: sinus rhythm I ordered imaging studies including CT head. I independently visualized and interpreted imaging which showed no acute process. I agree with the radiologist interpretation. I requested consultation with the Neurologist on-call Dr. Lindzen,  and  discussed lab and imaging findings as well as pertinent plan - they recommend: MRI and EEG while in ED. MRI without acute process. EEG reassuring. I have reviewed the patients home medicines and have made adjustments as needed   Social Determinants of Health:  none  4:31 PM Care of Randy Arias transferred to PA Harris at the end of my shift as the patient will require reassessment once labs/imaging have resulted. Patient presentation, ED course, and plan of care discussed with review of all pertinent labs and imaging. Please see his/her note for further details regarding further ED course and disposition. Plan at time of handoff is reassess patient after Lindzen reviews workup. This may be altered or completely changed at the discretion of the oncoming team pending results of further workup.       Final diagnoses:  Seizure [facility])    ED Discharge Orders          Ordered    Ambulatory referral to Neurology       Comments: An appointment is requested in approximately: 2 weeks   06/24/24 1601               Hoy Nidia FALCON, [place] 06/24/24  1631    Tegeler, Lonni PARAS, MD 06/25/24 [phone]

## 2024-06-24 NOTE — ED Triage Notes (Signed)
 Patient BIB [facility], called by patient SO for possible seizure.  She reports could be possible heat exposure.  EMS reported diaphoresis and orthostatic HTS 175 down to 150 sys, HR from 130-72 after 300 fluid bolus. Patient states recently quit smoking marijuana and thinks that's what the problem is.

## 2024-06-24 NOTE — Progress Notes (Signed)
STAT EEG complete - results pending. ? ?

## 2024-06-24 NOTE — Procedures (Signed)
 Patient Name: Randy Arias  MRN: [id number]  Epilepsy Attending: Arlin MALVA Krebs  Referring Physician/Provider: Hoy Nidia JULIANNA DEVONNA  Date: 06/24/2024 Duration: 33.58 mins  Patient history: 32yo with seizure. EEG to evaluate for seizure  Level of alertness: Awake  AEDs during EEG study: None  Technical aspects: This EEG study was done with scalp electrodes positioned according to the 10-20 International system of electrode placement. Electrical activity was reviewed with band pass filter of 1-70Hz , sensitivity of 7 uV/mm, display speed of 74mm/sec with a 60Hz  notched filter applied as appropriate. EEG data were recorded continuously and digitally stored.  Video monitoring was available and reviewed as appropriate.  Description: The posterior dominant rhythm consists of 9 Hz activity of moderate voltage (25-35 uV) seen predominantly in posterior head regions, symmetric and reactive to eye opening and eye closing. Hyperventilation and photic stimulation were not performed.     IMPRESSION: This study is within normal limits. No seizures or epileptiform discharges were seen throughout the recording.  A normal interictal EEG does not exclude the diagnosis of epilepsy.   Teofilo Lupinacci O Lavontay Kirk

## 2024-06-24 NOTE — Discharge Instructions (Addendum)
 ### First Seizure Discharge Instructions     **Instructions After a First-Time Seizure**      **1. Understanding Your Seizure and Risk of Recurrence**      - After a first unprovoked seizure, the risk of having another seizure is highest in the first 2 years (about 21-45%). This risk may be higher if there is a prior brain injury, abnormal brain imaging, abnormal EEG, or if the seizure happened during sleep.[1][2]      - Most people do not require immediate anti-seizure medication after a first seizure unless there are high-risk features. This will be discussed further at your neurology follow-up.[1][2]      **2. [place]  State Driving Precautions**      - **You must not drive any motor vehicle after a first seizure.**      - In [place] , you are required to be seizure-free for at least 6 months before resuming noncommercial driving. This is to ensure your safety and the safety of others on the road.[3]      - You are responsible for self-reporting your seizure to the [place]  Division of Motor Vehicles [facility]). Physicians in [place]  are not required to report seizures, but you must comply with state law regarding driving restrictions.[4][3]      - Commercial driving (e.g., truck, bus, taxi) is not permitted until you have been seizure-free for at least 2-5 years, depending on the type of license and seizure.[3]      - Do not drive until cleared by your neurologist and the DMV.      **3. Lifestyle Modifications and Safety**      - **Avoid activities that could be dangerous if you have another seizure:**      - Do not swim alone or take baths; showers are safer.[3][5]      - Avoid climbing ladders, working at heights, or operating heavy machinery.[3][5]      - Do not use power tools or cook over open flames unless someone is nearby.      - **Reduce seizure risk by:**      - Getting regular sleep and avoiding sleep deprivation.[2][3]      - Limiting or  avoiding alcohol, as it can increase seizure risk and interact with medications.[2][3]      - Avoiding recreational drugs and any medications not prescribed to you.      - Managing stress and avoiding known seizure triggers.      - **Inform family, friends, and coworkers** about your seizure and what to do if you have another one.      - **Sudden Unexpected Death in Epilepsy (SUDEP)** is rare after a first seizure but is more common in people with frequent generalized seizures. Discuss any concerns with your neurologist.[3]      **4. Medication and Adherence**      - If you are prescribed an anti-seizure medication, take it exactly as directed. Do not stop or change the dose without medical advice.[1][2]      - Be aware of possible side effects, which are usually mild and reversible.[1]      **5. When to Seek Immediate Medical Attention**      - Go to the emergency room if you have:      - Another seizure lasting more than 5 minutes.      - Multiple seizures close together without full recovery in between.      - Trouble breathing, severe injury, or do not return to  normal after a seizure.      **6. Follow-Up**      - Schedule an appointment with outpatient neurology within 2 weeks for further evaluation, discussion of test results (such as EEG or MRI), and to make a plan for ongoing care.[2]      **Summary**      - Do not drive until cleared by your neurologist and the DMV (minimum 6 months seizure-free in [place] ).      - Follow safety and lifestyle recommendations to reduce your risk of another seizure.      - Attend your neurology follow-up in 2 weeks.      - Contact your healthcare provider with any questions or concerns.      For more information, visit the Epilepsy Foundation website or discuss with your neurology provider at your next appointment.      ### References  1. Evidence-Based Guideline: Management of an Unprovoked First Seizure in Adults: Report of  the Guideline Development Subcommittee of the American Academy of Neurology and the American Epilepsy Society. Iven DELENA Marrion GORMAN Albertha GS, et al. Neurology. 2015;84(16):1705-13. doi:10.1212/WNL.9999999999998512. 2. Initial Management of Seizure in Adults. Smith PEM. The [place] Journal of Medicine. 2021;385(3):251-263. doi:10.1056/NEJMcp2024526. 3. New-Onset Seizure in Adults and Adolescents: A Review. Brian RADDLE, Schuele SU. JAMA. 2016;316(24):2657-2668. doi:10.1001/jama.7983.81374. 4. Seizures, Driver Licensure, and Medical Reporting Update: An AAN Position Statement. Tolchin B, Krauss GL, Spanaki MV, et al. Neurology. 2025;104(7):e213459. doi:10.1212/WNL.9999999999786540. 5. Evaluation After a First Seizure in Adults. Lowry MARLA Holts CE. American Family Physician. 2022;105(5):507-513.

## 2024-06-24 NOTE — ED Notes (Signed)
 EEG at bedside.

## 2024-07-14 ENCOUNTER — Encounter: Payer: Self-pay | Admitting: Neurology

## 2024-09-17 ENCOUNTER — Ambulatory Visit: Payer: Self-pay | Admitting: Neurology

## 2024-09-17 ENCOUNTER — Encounter: Payer: Self-pay | Admitting: Neurology

## 2024-11-30 ENCOUNTER — Other Ambulatory Visit: Payer: Self-pay

## 2024-11-30 ENCOUNTER — Emergency Department (HOSPITAL_COMMUNITY)
Admission: EM | Admit: 2024-11-30 | Discharge: 2024-11-30 | Disposition: A | Discharge status: Home / Self Care | Payer: Self-pay

## 2024-11-30 DIAGNOSIS — R569 Unspecified convulsions: Secondary | ICD-10-CM | POA: Insufficient documentation

## 2024-11-30 LAB — CBC
HCT: 48.5 % (ref 39.0–52.0)
Hemoglobin: 15.6 g/dL (ref 13.0–17.0)
MCH: 25.9 pg — ABNORMAL LOW (ref 26.0–34.0)
MCHC: 32.2 g/dL (ref 30.0–36.0)
MCV: 80.6 fL (ref 80.0–100.0)
Platelets: 353 K/uL (ref 150–400)
RBC: 6.02 MIL/uL — ABNORMAL HIGH (ref 4.22–5.81)
RDW: 14 % (ref 11.5–15.5)
WBC: 15.6 K/uL — ABNORMAL HIGH (ref 4.0–10.5)
nRBC: 0 % (ref 0.0–0.2)

## 2024-11-30 LAB — BASIC METABOLIC PANEL WITH GFR
Anion gap: 12 (ref 5–15)
BUN: 8 mg/dL (ref 6–20)
CO2: 25 mmol/L (ref 22–32)
Calcium: 10 mg/dL (ref 8.9–10.3)
Chloride: 102 mmol/L (ref 98–111)
Creatinine, Ser: 1.12 mg/dL (ref 0.61–1.24)
GFR, Estimated: >60 mL/min
Glucose, Bld: 116 mg/dL — ABNORMAL HIGH (ref 70–99)
Potassium: 4.3 mmol/L (ref 3.5–5.1)
Sodium: 138 mmol/L (ref 135–145)

## 2024-11-30 LAB — CBG MONITORING, ED: Glucose-Capillary: 143 mg/dL — ABNORMAL HIGH (ref 70–99)

## 2024-11-30 LAB — MAGNESIUM: Magnesium: 2.7 mg/dL — ABNORMAL HIGH (ref 1.7–2.4)

## 2024-11-30 MED ORDER — FUROSEMIDE 40 MG PO TABS
20.0000 mg | ORAL_TABLET | Freq: Once | ORAL | Status: AC
  Administered 2024-11-30: 20 mg via ORAL
  Filled 2024-11-30: qty 1

## 2024-11-30 MED ORDER — LEVETIRACETAM 500 MG PO TABS
1000.0000 mg | ORAL_TABLET | Freq: Once | ORAL | Status: AC
  Administered 2024-11-30: 1000 mg via ORAL
  Filled 2024-11-30: qty 2

## 2024-11-30 MED ORDER — LEVETIRACETAM 500 MG PO TABS: 500.0000 mg | ORAL_TABLET | Freq: Two times a day (BID) | ORAL | 1 refills | Status: AC

## 2024-11-30 NOTE — Discharge Instructions (Addendum)
 We have prescribed you seizure medications to help event future seizures.  Please take them as prescribed.  Please follow-up with neurology as soon as possible.  I have placed a referral, they should call to schedule an appointment, however if you do not hear from them in the next few days, please call their office to schedule an appointment.  Return for fevers, chills, severe headache, vision loss, facial droop, unilateral weakness, recurrent seizures, lethargy, or any new or worsening symptoms that are concerning to you.  Please have increase your fluid intake today.  Please follow-up with your primary doctor for repeat lab testing as your magnesium level was higher than expected today.

## 2024-11-30 NOTE — ED Triage Notes (Signed)
 Pt arrives via EMS from a job interview. Pt had a  witnessed seizure lasting approx 2-3 minutes. Pt landed on a carpeted surface, did not strike head per bystanders. Vomiting X2. Pt does not have a hx of seizures other than 1 previous seizure 6 months ago.   20GA LEFT AC.  No meds given by EMS.

## 2024-11-30 NOTE — ED Provider Notes (Signed)
 " [place] EMERGENCY DEPARTMENT AT [facility] Provider Note   CSN: [id number] Arrival date & time: 11/30/24  1011     Patient presents with: Seizures   Rahman R Winterton is a 33 y.o. male.   This is a 33 year old male presenting emergency department after having some seizure-like activity while at an interview.  Reported he had a seizure back in July, but no other history, not on seizure medications.  Has not been able to follow-up with neurology.  He reportedly had a few minutes of generalized tonic-clonic type activity.  He was sitting down, was lowered to the ground.  Does not feel like he bit his tongue or have bowel or bladder incontinence.  He has been acting normal self per family member who is bedside.   Seizures      Prior to Admission medications  Medication Sig Start Date End Date Taking? Authorizing Provider  levETIRAcetam  (KEPPRA ) 500 MG tablet Take 1 tablet (500 mg total) by mouth 2 (two) times daily. 11/30/24  Yes Neysa Caron PARAS, DO  clotrimazole  (LOTRIMIN ) 1 % cream Apply to affected area 2 times daily 11/15/16   Mackuen, Courteney Lyn, MD  cyclobenzaprine  (FLEXERIL ) 10 MG tablet Take 1 tablet (10 mg total) by mouth 2 (two) times daily as needed for muscle spasms. 09/11/20   Clemencia Duncan, MD    Allergies: Patient has no known allergies.    Review of Systems  Neurological:  Positive for seizures.    Updated Vital Signs BP (!) 152/107   Pulse 80   Temp 98.2 F (36.8 C) (Oral)   Resp 10   SpO2 100%   Physical Exam Vitals and nursing note reviewed.  Constitutional:      General: He is not in acute distress.    Appearance: He is not toxic-appearing.  HENT:     Head: Normocephalic.     Nose: Nose normal.     Mouth/Throat:     Mouth: Mucous membranes are moist.  Eyes:     Conjunctiva/sclera: Conjunctivae normal.  Cardiovascular:     Rate and Rhythm: Normal rate and regular rhythm.  Pulmonary:     Effort: Pulmonary effort is normal.      Breath sounds: Normal breath sounds.  Abdominal:     General: Abdomen is flat. There is no distension.     Tenderness: There is no abdominal tenderness. There is no guarding or rebound.  Musculoskeletal:        General: Normal range of motion.  Skin:    General: Skin is warm and dry.     Capillary Refill: Capillary refill takes less than 2 seconds.  Neurological:     General: No focal deficit present.     Mental Status: He is alert and oriented to person, place, and time.  Psychiatric:        Mood and Affect: Mood normal.        Behavior: Behavior normal.     (all labs ordered are listed, but only abnormal results are displayed) Labs Reviewed  CBC - Abnormal; Notable for the following components:      Result Value   WBC 15.6 (*)    RBC 6.02 (*)    MCH 25.9 (*)    All other components within normal limits  BASIC METABOLIC PANEL WITH GFR - Abnormal; Notable for the following components:   Glucose, Bld 116 (*)    All other components within normal limits  MAGNESIUM - Abnormal; Notable for the following components:  Magnesium 2.7 (*)    All other components within normal limits  CBG MONITORING, ED    EKG: None  Radiology: No results found.   Procedures   Medications Ordered in the ED  levETIRAcetam  (KEPPRA ) tablet 1,000 mg (1,000 mg Oral Given 11/30/24 1155)  furosemide  (LASIX ) tablet 20 mg (20 mg Oral Given 11/30/24 1208)    Clinical Course as of 11/30/24 1539  Mon Nov 30, 2024  1126 Spoke with neurology.  Given negative workup previously no need for admission.  Would give Keppra   1000 mg now and home on 500mg  BID.  [TY]  1142 WBC(!): 15.6 Presents with chronic elevation, improved compared to prior. [TY]  1538 Magnesium(!): 2.7 Hyper mag noted.  Given a dose of Lasix .  Instructed to increase fluid intake, follow-up with PCP for recheck.  [TY]  1538 Discharged in stable condition.  Did tell patient to not drive for 6 months until seen by neurology. [TY]     Clinical Course User Index [TY] Neysa Caron PARAS, DO                                 Medical Decision Making This is a well-appearing 33 year old male with no significant past medical history presenting emergency department after reported seizure-like activity.  He is afebrile nontachycardic, slightly hypertensive.  Physical exam is alert and orient x 3.  No focal neurologic deficits.  Moving his neck freely.  No rash.  He is back to baseline per family member who is in the room.  Has not followed up with neurology.  Per chart review did have extensive workup after initial seizure to include negative MRI and EEG.  Will get screening labs.  Will touch base with neurology regarding starting antibiotics at this point.  She is precautions ordered.  Will observe in emergency department as well.  See ED course for further MDM final disposition.  Amount and/or Complexity of Data Reviewed Labs: ordered. Decision-making details documented in ED Course.  Risk Prescription drug management.      Final diagnoses:  Seizure-like activity (HCC)  Hypermagnesemia    ED Discharge Orders          Ordered    levETIRAcetam  (KEPPRA ) 500 MG tablet  2 times daily        11/30/24 1143    Ambulatory referral to Neurology       Comments: An appointment is requested in approximately: 4 weeks   11/30/24 1144               Neysa Caron PARAS, DO 11/30/24 1539  "

## 2024-12-23 ENCOUNTER — Encounter: Payer: Self-pay | Admitting: Neurology

## 2025-02-10 ENCOUNTER — Ambulatory Visit: Payer: Self-pay | Admitting: Neurology
